# Patient Record
Sex: Male | Born: 1980 | Race: Black or African American | Hispanic: No | Marital: Married | Smoking: Never smoker
Health system: Southern US, Community
[De-identification: ages and names within clinical notes are randomized; demographics above are authoritative.]

## PROBLEM LIST (undated history)

## (undated) DIAGNOSIS — J45909 Unspecified asthma, uncomplicated: Secondary | ICD-10-CM

---

## 2003-11-16 ENCOUNTER — Emergency Department (HOSPITAL_COMMUNITY): Admission: EM | Admit: 2003-11-16 | Discharge: 2003-11-16 | Payer: Self-pay | Admitting: Emergency Medicine

## 2003-11-27 ENCOUNTER — Ambulatory Visit (HOSPITAL_BASED_OUTPATIENT_CLINIC_OR_DEPARTMENT_OTHER): Admission: RE | Admit: 2003-11-27 | Discharge: 2003-11-27 | Payer: Self-pay | Admitting: Orthopedic Surgery

## 2004-07-02 ENCOUNTER — Ambulatory Visit (HOSPITAL_BASED_OUTPATIENT_CLINIC_OR_DEPARTMENT_OTHER): Admission: RE | Admit: 2004-07-02 | Discharge: 2004-07-02 | Payer: Self-pay | Admitting: Orthopedic Surgery

## 2008-07-31 ENCOUNTER — Emergency Department (HOSPITAL_COMMUNITY): Admission: EM | Admit: 2008-07-31 | Discharge: 2008-07-31 | Payer: Self-pay | Admitting: Emergency Medicine

## 2008-08-08 ENCOUNTER — Ambulatory Visit: Payer: Self-pay | Admitting: Radiology

## 2008-08-08 ENCOUNTER — Ambulatory Visit (HOSPITAL_BASED_OUTPATIENT_CLINIC_OR_DEPARTMENT_OTHER): Admission: RE | Admit: 2008-08-08 | Discharge: 2008-08-08 | Payer: Self-pay | Admitting: Family Medicine

## 2009-09-22 IMAGING — CR DG WRIST COMPLETE 3+V*R*
4 series · 4 of 4 positions shown · non-contrast
Comparison: None

CLINICAL DATA: Right wrist pain

RIGHT WRIST - COMPLETE 3+ VIEW

[x wrist pa right]
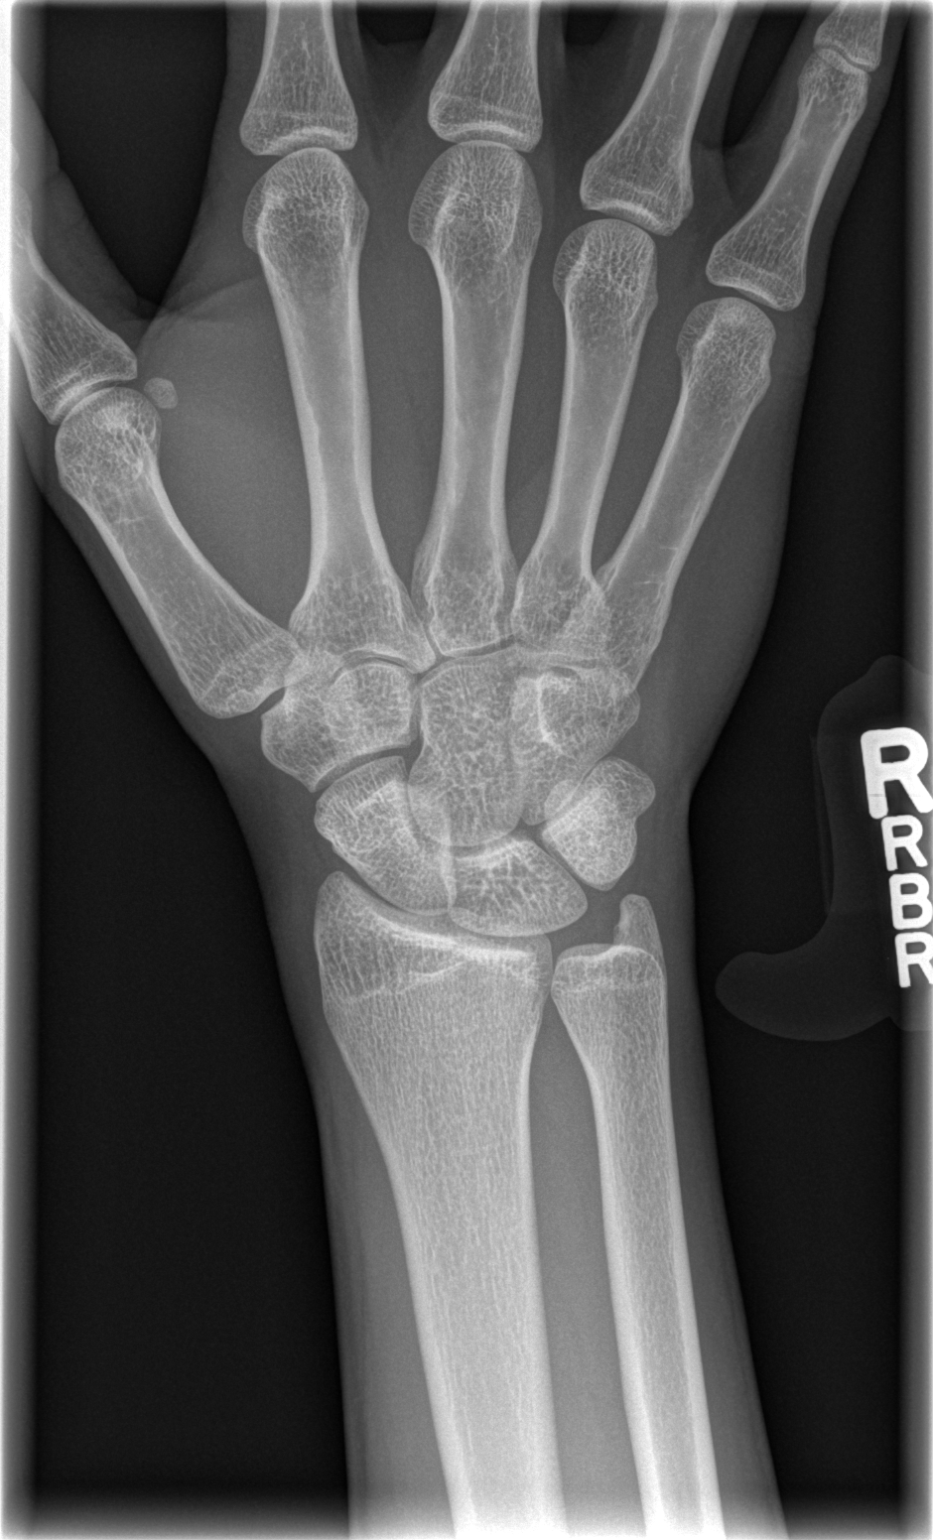

[x wrist obl right]
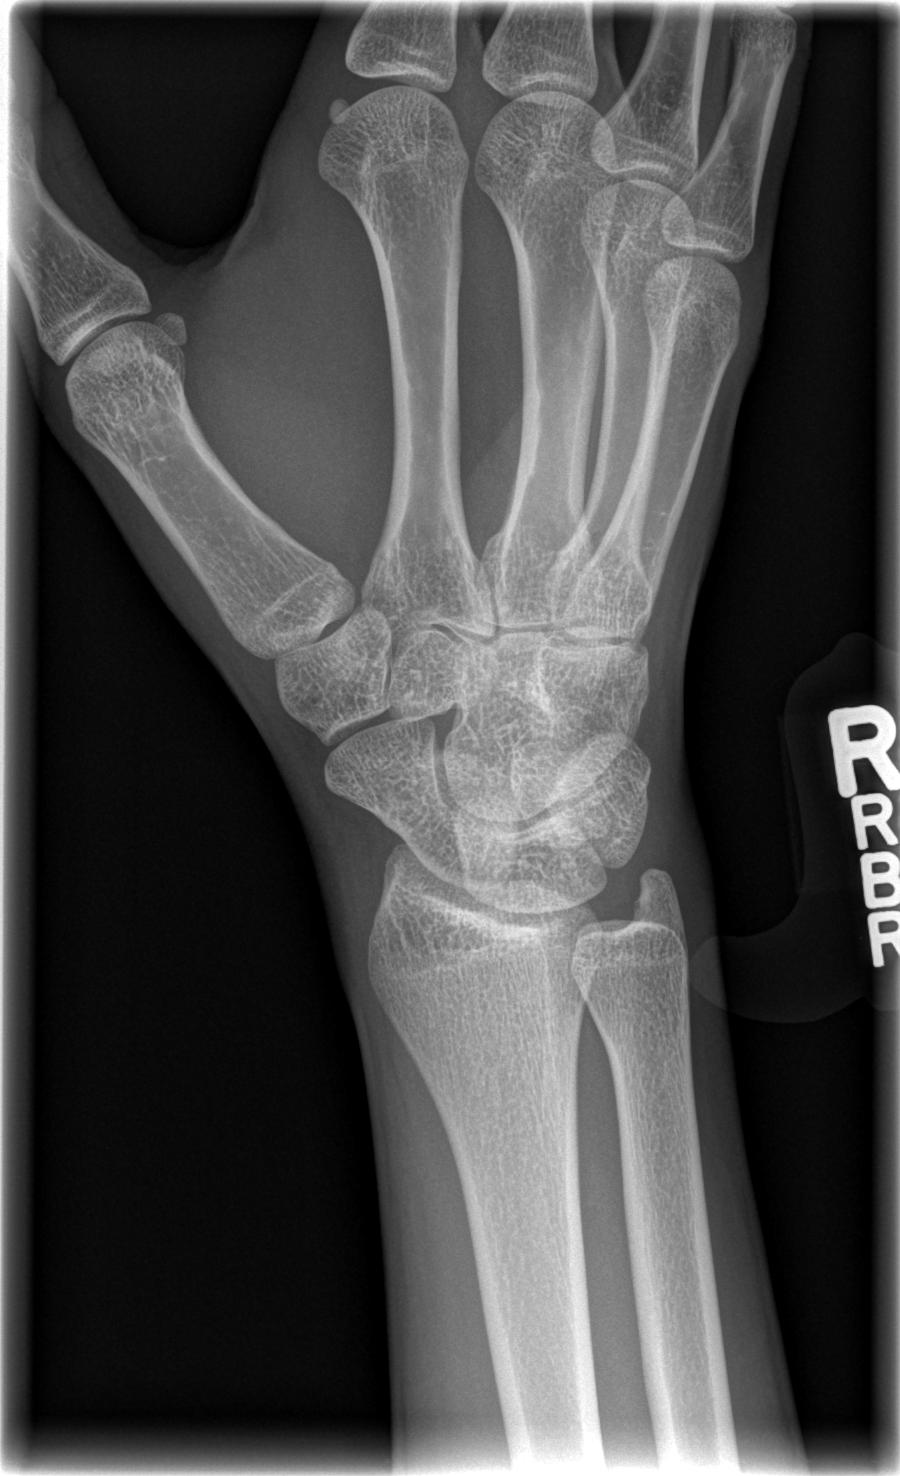

[x wrist lat right]
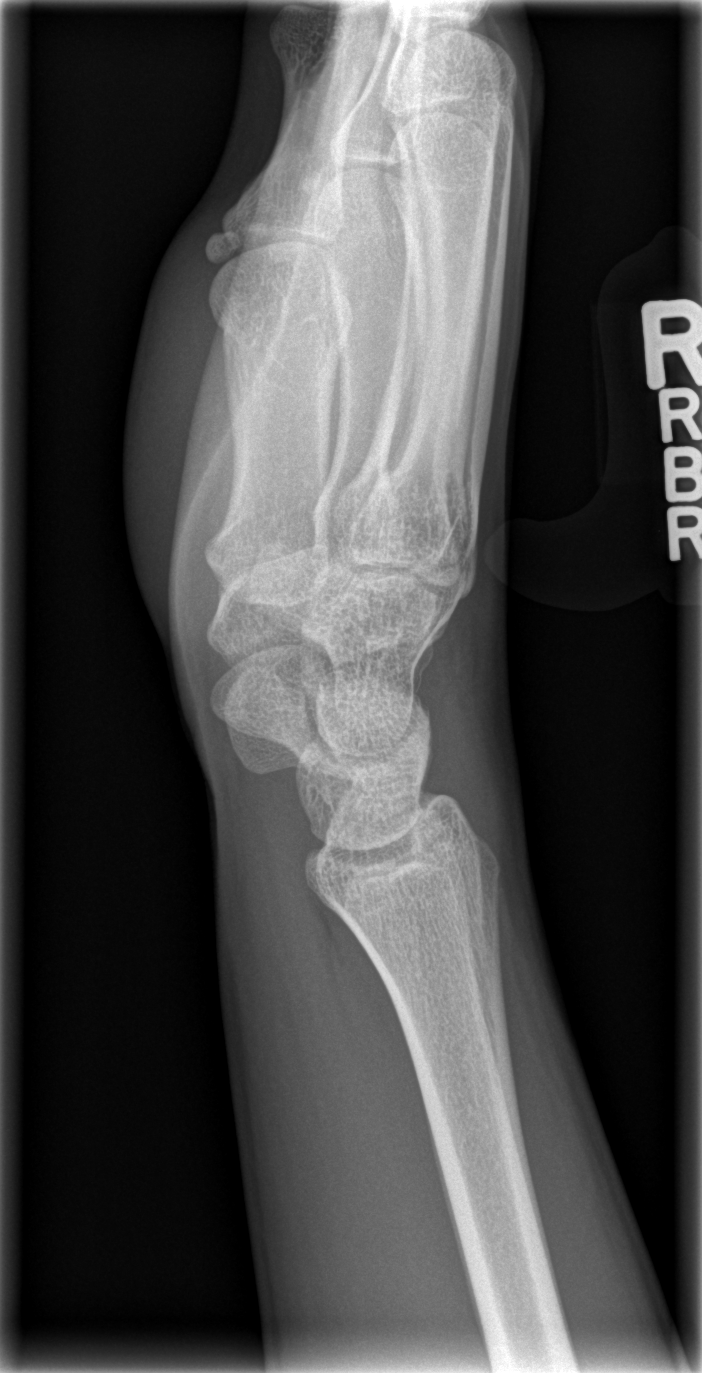

[x navicular]
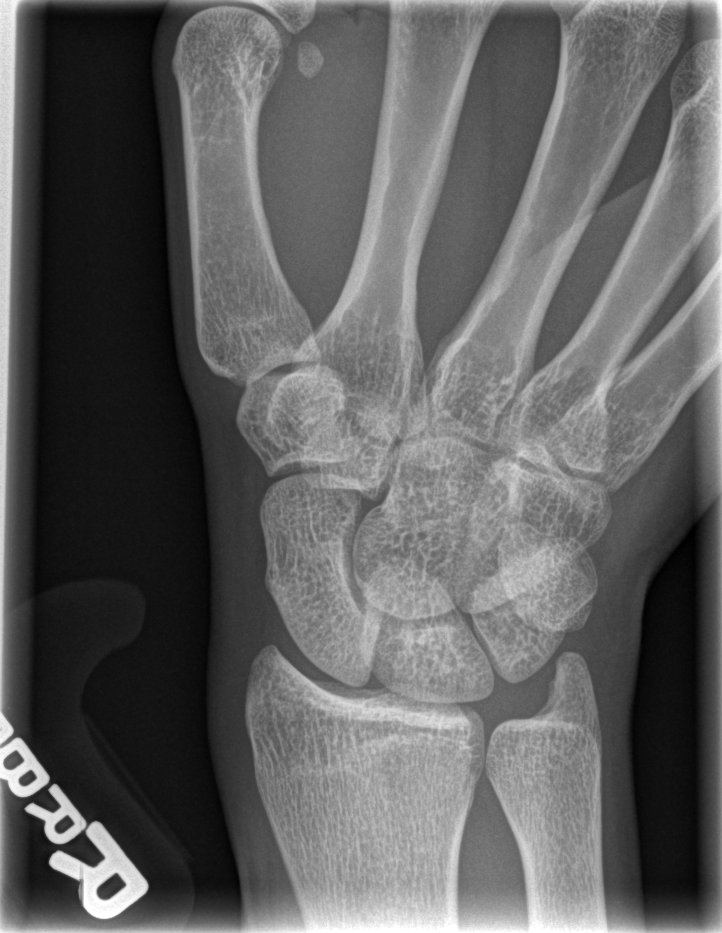

[4 of 4 positions shown; findings below may reference images not displayed]

FINDINGS: There is no evidence of fracture or dislocation.  There
is no evidence of arthropathy or other focal bone abnormality.
Soft tissues are unremarkable.
IMPRESSION: Negative.

## 2010-08-08 NOTE — Op Note (Signed)
NAMEADISA, LITT              ACCOUNT NO.:  [id number]   MEDICAL RECORD NO.:  [id number]          PATIENT TYPE:  AMB   LOCATION:  DSC                          FACILITY:  [facility]   PHYSICIAN:  Leonides Grills, M.D.     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   OPERATION:  1.  Right first and second TMT joint effusion.  2.  Right calcaneal local bone graft.  3.  Stress x-rays, right foot.   ANESTHESIA:  General endotracheal.   SURGEON:  Leonides Grills, M.D.   ASSISTANT:  Lianne Cure, P.A.-C.   ESTIMATED BLOOD LOSS:  Minimal.   TOURNIQUET TIME:  Approximately an hour and a half.   COMPLICATIONS:  None.   DISPOSITION:  Stable to PR.   INDICATIONS:  This is a 30 year old male who sustained a pure ligamentous  Lisfranc injury to the first through fifth tarsometatarsal joints. After  hardware was removed at five months, he had subluxation of first and second  TMT joints with pain. He was consented for the above procedure. All risks  which included infection, neurovascular injury, nonunion, malunion,  __________< hardware failure, persistent pain, worsening pain, prolonged  recovery, stiffness, arthritis __________ were all explained. Questions were  encouraged and answered.   OPERATIVE NOTE:  The patient was brought to the operating room, placed in  supine position. After adequate general endotracheal anesthesia was  administered with popliteal blocks as well as Ancef 1 g IV piggyback, right  lower extremity was then prepped and draped in a sterile manner. With a  proximally placed thigh tourniquet, limb was gradually exsanguinated.  Tourniquet was elevated to 290 mmHg. Longitudinal  incision was made through  the previous incision on the dorsal aspect of the right medial mid foot.  Dissection was carried down through skin. Hemostasis was obtained. Interval  between the extensor hallicis longus and brevis was then developed. Soft  tissue was elevated off the first and second tarsometatarsal joints  dorsally, and both joints were entered. Remaining cartilage was removed. The  curved 1/4-inch osteotome as well as a curet and all Lisfranc ligament was  removed as well. Multiple 2-mm drill holes were placed on either side of the  joint. Longitudinal incision was then made over the calcaneus. A 6-0 drill  hole was placed over the lateral calcaneal tuber lateral wall, and  cancellous bone graft was then obtained using a curet. This was then placed  in the first and second TMT joints, and we first started with the second TMT  joint with a 2-point reduction clamp reducing it anatomically compressing it  and then placing a 3.5-mm fully threaded cortical facet screw using a 2.5-mm  drill hole __________ over the medial aspect of the medial cuneiform,  spanning from the medial cuneiform into the base of the second metatarsal.  Clamp was removed, and this  had excellent maintenance of the compression  across the second TMT joint previous Lisfranc ligament area. We then  anatomically reduced the first TMT joint and with a 2-point reduction clamp  compressed this. We created a notch at the base of the first metatarsal and  a 3.5-mm fully threaded corticol screw using a 3.5/2.5 mm drill holes  respectively creating a lag effect was then performed. This had excellent  compression and maintenance of the correction. Bur was then used to create a  notch into the base of the middle cuneiform, and 3.5/2.5 mm drill holes were  placed respectively, and a 3.5-mm fully threaded cortical lag screw was  placed as well. Final stress x-rays were obtained in AP, lateral and oblique  planes, and these  showed no gross motion of the arthrodesis site. Fixation  was in the proper position as well. Areas were copiously irrigated with  normal saline. Bur was used and bone graft obtained throughout the procedure  as well as from the calcaneus and was then placed as stress strain leaving  bone graft as described by Dutch Quint. Tourniquet was deflated. Hemostasis was  obtained. There was palpable dorsalis pedis pulse. There was no pulsatile  bleeding. Subcu was closed with 3-0 Vicryl. Skin was closed with 4-0 nylon  over all wounds. Sterile dressing was applied. Modified Jones dressing was  applied. The patient was stable to the PR.      PB/MEDQ  D:  07/02/2004  T:  07/02/2004  Job:  [id number]

## 2010-08-08 NOTE — Op Note (Signed)
Joe Quinn, Joe Quinn                          ACCOUNT NO.:  [id number]   MEDICAL RECORD NO.:  [id number]                   PATIENT TYPE:  AMB   LOCATION:  DSC                                  FACILITY:  [facility]   PHYSICIAN:  Leonides Grills, M.D.                  DATE OF BIRTH:  1980/03/29   DATE OF PROCEDURE:  11/27/2003  DATE OF DISCHARGE:                                 OPERATIVE REPORT   PREOPERATIVE DIAGNOSES:  1.  Right first through fifth Lisfranc fracture dislocation, i.e.,      tarsometatarsal joint fracture dislocation.  2.  Right inner cuneiform fracture dislocation.   POSTOPERATIVE DIAGNOSES:  1.  Right firs through fifth Lisfranc fracture dislocation, i.e.,      tarsometatarsal joint fracture dislocation.  2.  Right inner cuneiform fracture dislocation.   OPERATION:  1.  Open reduction-internal fixation first through third tarsometatarsal      joints.  2.  Closed reduction and percutaneous pin fixation fourth and fifth      tarsometatarsal joint.  3.  Stress x-ray, right foot.  4.  Open reduction-internal fixation inner cuneiform fracture dislocation.   SURGEON:  Leonides Grills, M.D.   ASSISTANT:  Lianne Cure, P.A.   ANESTHESIA:  General endotracheal with popliteal block.   ESTIMATED BLOOD LOSS:  Minimal.   TOURNIQUET TIME:  Approximately an hour.   COMPLICATIONS:  None.   DISPOSITION:  Stable to the PR.   INDICATIONS:  This is a 30 year old male, who sustained the above injury  after elevation of soft tissue evaluation.  We brought him back for surgery  as described above.  He was consented for the above procedure. All risks  which include infection, neurovascular injury, persistent pain, worsening  pain, stiffness, arthritis and possible fusion were all explained. Questions  were encouraged and answered.  He was also encouraged to abstain from  smoking as well.   OPERATION:  The patient was brought to the operating room and placed in the  supine  position. After adequate general endotracheal anesthesia was  administered, as well as the popliteal block and Ancef one gram IV  piggyback, the right lower extremity was then prepped and draped in a  sterile manner with a proximally placed thigh tourniquet.  The limb was  gravity exsanguinated, the tourniquet was elevated at 290 mmHg. A  longitudinal incision just lateral to the HL tendon was then made.  Dissection was carried down through the skin.  Hemostasis was obtained.  Approach was just lateral to the HL tendon between [facility] and [facility].  The first  tarsometatarsal joint and the second tarsometatarsal joint were encountered.  The second tarsometatarsal joint was anatomically reduced with a two-point  reduction clamp and then, a 3.5 mm fully threaded, cortical set screw was  then placed using a 2.5 mm drill hole respectively.  This had excellent  maintenance of the reduction and was  visually under C-arm guidance to be in  excellent positon.  Stress x-rays were obtained and showed that there was  complete instability of the first tarsometatarsal joint as well as the  remaining tarsometatarsal joints as well.  We then anatomically reduced the  first tarsometatarsal joint.  A provisional K-wire was then placed to hold  the reduction and a burr was used to create a notch approximately 2 cm  distal to the first tarsometatarsal joint.  With the joint anatomically held  reduced, a 3.5, followed by a 2.5 mm drill hole was then placed and a lag  screw was then placed.  X-rays again were obtain the AP, lateral and oblique  planes and showed that this was in excellent position.  We then made a  longitudinal incision over the third tarsometatarsal joint.  Dissection was  carried out through the skin and hemostasis was obtained.  The third  tarsometatarsal was then anatomically reduced with a two-point reduction  clamp and then a 3.5 mm fully threaded, cortical set screw was then placed  using a 2.5 mm  drill hole respectively and a burr to notch it again at the  base of the third metatarsal. X-rays were obtain in the AP, lateral and  oblique planes and showed that this was in excellent position.  We then with  a two-point reduction clamp, reduced the inner cuneiform joints and through  a separate incision medially, a 3.5 mm fully threaded, cortical set screw  was then placed.  Again, this was visualized in the AP, lateral and oblique  planes to be in excellent position.  We then anatomically reduced the fourth  and fifth tarsometatarsal joints with 2-0 K-wires.  The two 2-0 K-wires were  placed across the fourth and fifth tarsometatarsal joints respectively and  held the joints in an anatomically reduced position.  The tourniquet was  deflated, hemostasis was obtained.  Wounds were copiously irrigated with  normal saline.  Subcutaneous was closed with 3-0 Vicryl and skin was closed  with 4-0 nylon over all wounds. Sterile dressing was applied.  A modified  Jones dressing was applied.   The patient was stable to the PR.                                               Leonides Grills, M.D.    PB/MEDQ  D:  11/27/2003  T:  11/27/2003  Job:  [id number]

## 2010-08-08 NOTE — Op Note (Signed)
NAMECON, ARGANBRIGHT                          ACCOUNT NO.:  [id number]   MEDICAL RECORD NO.:  [id number]                   PATIENT TYPE:  EMS   LOCATION:  ED                                   FACILITY:  [facility]   PHYSICIAN:  Vania Rea. Supple, M.D.               DATE OF BIRTH:  1980-03-30   DATE OF PROCEDURE:  11/16/2003  DATE OF DISCHARGE:                                 OPERATIVE REPORT   PREOPERATIVE DIAGNOSIS:  Right forefoot tarsal metatarsal joint dislocation  (homolateral Lisfranc dislocation).   POSTOPERATIVE DIAGNOSIS:  Right forefoot tarsal metatarsal joint dislocation  (homolateral Lisfranc dislocation).   PROCEDURE:  Closed reduction under IV sedation of right forefoot  dislocation.   SURGEON OF RECORD:  Francena Hanly, M.D.   ANESTHESIA:  IV sedation.   HISTORY:  Mr. Rudzinski is a 30 year old gentleman, who injured his right foot  early this morning while wrestling at home.  He had immediate complaints  of right foot pain and inability to bear weight.  On evaluation in the  emergency room, he was found to have a swollen mid foot and forefoot with an  obvious lateral translation of the forefoot and metatarsals.  Radiographs  were obtained confirming a complete dislocation of the tarsometatarsal  joints consistent with a homolateral Lisfranc dislocation.  Subsequent  recommendation has been made for closed reduction.  I counseled Mr. Leh  on treatment options as well as risks versus benefits thereof.  Possible  complications of failure of reduction, recurrent instability, and the  probable need for additional internal fixation at a later date will all be  reviewed.  He understands and accepts and agrees with the planned procedure.   PROCEDURE IN DETAIL:  Using routine monitoring, the patient received IV  sedation.  After appropriate relaxation, the first and second toes of the  right foot were placed into finger traps, and 10 pounds o weight was  suspended from the  ankle.  The soft tissue was then allowed to relax and  with appropriate relaxation, manipulation technique was then performed,  reducing the metatarsal bases into the appropriate aspects of the mid tarsal  joints.  A palpable and audible reduction was achieved.  Postreduction x-  rays were then obtained confirming congruent alignment of the mid tarsal  joints.   A bulky dry dressing was then applied.   Postop plans are for patient to be discharged home with Percocet for pain  medication.  Strict icing and elevation.  Nonweightbearing.  Follow up in  the office Monday with my partner, Dr. Leonides Grills for further discussion,  ongoing treatment options, and management.                                               Vania Rea. Supple, M.D.  KMS/MEDQ  D:  11/16/2003  T:  11/17/2003  Job:  [id number]

## 2014-04-22 NOTE — Progress Notes (Signed)
 Quick Note:  Results reviewed during clinic vist ______

## 2014-04-22 NOTE — Progress Notes (Signed)
 Subjective   Patient ID:  Joe Quinn is a 34 y.o. (DOB October 05, 1980) male.     Patient presents with   Ankle Pain    states fell down stairs last night, left ankle inury, swollen and pain, diff walking    Pt presents with L ankle injury/pain:  DOI: 04/21/14, coming down stairs and missed footing, slipped forward down 5 stairs with his L ankle hyperextended in effort to break his fall, caught himself with banister and managed to stay upright. Pain focused in the medial and posterior aspect of the ankle, erythematous along medial malleolus, min swelling, painful to bear weight on it No numbness, tingling, weakness, loss of function, unilateral leg swelling, change of temp in skin of LE. No back pain or pain in UE. He passively elevated it No meds or ice packs yet Hx of L ankle sprain in HS basketball Hx of R ankle fracture  Past Medical History, Past Surgery History, Allergies, Social History, and Family History were reviewed and updated.    Review of Systems is complete and negative except as noted.  Objective   BP 134/73 mmHg  Pulse 83  Temp(Src) 99.3 F (37.4 C) (Oral)  Resp 20  Ht 5' 11 (1.803 m)  Wt 195 lb (88.451 kg)  BMI 27.21 kg/m2 General:  Well Developed, Well Nourished, No distress, well appearing HEENT: Normocephalic, atraumatic, PERRL, EOMI, anicteric sclera, conjunctiva normal, oropharynx moist and clear Neck:  Supple with normal range of motion, no bruit, thyomegaly,no cervical/auricular/supraclavicular LAD  CV:  S1S2, RRR without MGR  Lungs:  CTA with normal effort Skin:  No Focal Rashes or lesions, see extremities for ankle description. Extremities:  No C/C/E.  DP/PT 2+. L ankle min ttp in medial malleolus and posterior aspect at distal portion of Achilles tendon w/o swelling, ecchymosis. There is very subtle swelling and erythema of medial malleolus. AROM of ankle limited by 50% due to apprehension of pain, PROM slightly better, pain with inversion, limp to  favor L ankle. Neg Thompson test. No metatarsal point tenderness, swelling, or ecchymosis. Toe flexion and extension against resistance 5/5, cap refill w/in 2 sec, sensation to light touch intact all surfaces. No unilateral leg swelling, neg Homan's, and normal L knee exam. No spine tenderness, hand grip 5/5.  Neuro:  No focal deficits, CN II-XII intact   Xr Ankle Left Ap Lateral And Oblique  04/22/2014   TECHNIQUE: 3 views left ankle  HISTORY: Fall  FINDINGS: No subluxation or acute fracture. Joint spaces are maintained. Mild soft tissue swelling lateral ankle.    04/22/2014   IMPRESSION: No acute fracture.     Impression   1. Ankle injury, left, initial encounter   2. Sprain of left ankle, unspecified ligament, initial encounter     Plan   Patient's Medications   No medications on file    Orders Placed This Encounter  Procedures   Ankle brace   Crutches   XR Ankle Left Ap Lateral And Oblique   RICE measures discussed Start Ibuprofen  (4) 200 mg tablets every 8 hours, always take with food and water to prevent GI upset. Pt requests opioid pain medication; advised anti-inflammatory medication best for this condition Wear air cast in daytime activities until seen in follow up by your primary care provider within next 7-10 days Weight bearing as tolerated Follow up with primary care within 10 days for recheck, sooner if current condition worsens Risks, benefits, and alternatives of the medications and treatment plan prescribed today were discussed, and  patient expressed understanding. Plan follow-up as discussed or as needed if any worsening symptoms or change in condition.    A yearly preventative health exam was recommended and current age based recommendations were discussed.

## 2015-04-11 NOTE — Progress Notes (Signed)
 Subjective:    Patient ID: Joe Quinn is a 35 y.o. male.  Chief Complaint  Patient presents with   mesenteric adenitis    2 day follow up    HPI Mesenteric Adenitis: Jahir presented 2 days ago with acute onset fever, anorexia, chills, and lower abdominal pain. CT abdomen and pelvis showed question a degree of mesenteric adenitis. He was started on Augmentin X 10 days and Ibuprofen . He reports today he still feels a little bloated. Abdominal pain now 5/10 lower abdomen compared to 8/10. No fevers. Is taking Antibiotics, has developed some diarrhea after contrast from CT scan. Appetite improving, ate rice and chicken today, tolerated well. Drinking well. Able to rest, stayed out of work for 2 days. He reports overall he feels he is 80 percent improved from 2 days ago.  Review of Systems  Constitutional: Positive for fatigue. Negative for appetite change, chills and fever.  Respiratory: Negative.   Cardiovascular: Negative.   Gastrointestinal: Positive for abdominal distention, abdominal pain and diarrhea. Negative for blood in stool, constipation, nausea and vomiting.   No Known Allergies   Current Outpatient Prescriptions:    amoxicillin-clavulanate (AUGMENTIN) 875-125 mg per tablet, Take 1 tablet by mouth 2 times daily., Disp: 20 tablet, Rfl: 0   There are no active problems to display for this patient.  Social History   Social History   Marital status: Married    Spouse name: N/A   Number of children: N/A   Years of education: N/A   Social History Main Topics   Smoking status: Current Some Day Smoker   Smokeless tobacco: None     Comment: occational   Alcohol use None   Drug use: None   Sexual activity: Not Asked   Other Topics Concern   None   Social History Narrative   Objective:  Physical Exam  Constitutional: He is oriented to person, place, and time. He appears well-developed and well-nourished. No distress.  HENT:  Head: Normocephalic and  atraumatic.  Mouth/Throat: Oropharynx is clear and moist. No oropharyngeal exudate.  Eyes: Conjunctivae are normal. Right eye exhibits no discharge. Left eye exhibits no discharge. No scleral icterus.  Cardiovascular: Normal rate, regular rhythm, normal heart sounds and intact distal pulses.  Exam reveals no gallop and no friction rub.   No murmur heard. Pulmonary/Chest: Effort normal and breath sounds normal. No respiratory distress. He has no wheezes. He has no rales.  Abdominal: Soft. Bowel sounds are normal. He exhibits no distension and no mass. There is tenderness. There is no rebound and no guarding.  Mild tenderness to left of umbilicus. No RLQ tenderness. No rebound or guarding.  Neurological: He is alert and oriented to person, place, and time.  Skin: Skin is warm and dry. He is not diaphoretic.  Nursing note and vitals reviewed.   Visit Vitals   BP 122/86 (Site: Right arm, Position: Sitting, BP Cuff Size: Small)   Pulse 75   Resp 12   Ht 1.803 m (5' 11)   Wt 91.2 kg (201 lb)   SpO2 98%   BMI 28.03 kg/m2     Assessment:    1. Mesenteric adenitis       Plan:    Patient Instructions  Mesenteric Adenitis: Overall improving. Continue full course of antibiotics. Continue progressing diet as tolerated. Push fluids. Return for worsening abdominal pain, fevers, vomiting, blood in stool, or any other worsening symptoms, otherwise return in 10-14 days for recheck.  Return in about 2 weeks (around  04/25/2015) for mesenteric adenitis.    Electronically signed by: Duwaine Lenon Lukes, FNP 04/11/2015 5:40 PM   Electronically signed by: Duwaine Lenon Lukes, FNP 04/14/15 1419

## 2020-12-26 NOTE — Telephone Encounter (Signed)
 I spoke to Joe Quinn and he said he wanted Joe Quinn to document medical need to work from home due to asthma triggers at work.  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    Electronically signed by: Almarie Lis Street, CMA 12/26/20 1201
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
## 2021-12-10 NOTE — Progress Notes (Signed)
 The consultant's note has been read, reviewed and appreciated; any recommendations will be addressed or forwarded on to the patient's PCP for further workup, evaluation and treatment as clinically indicated and appropriate.   Electronically signed by: D'Allura, Sal Anthony, DO 12/10/21 [id number]

## 2022-06-02 ENCOUNTER — Encounter: Payer: Self-pay | Admitting: Podiatry

## 2022-06-02 ENCOUNTER — Ambulatory Visit: Payer: BC Managed Care – PPO | Admitting: Podiatry

## 2022-06-02 ENCOUNTER — Ambulatory Visit (INDEPENDENT_AMBULATORY_CARE_PROVIDER_SITE_OTHER): Payer: BC Managed Care – PPO

## 2022-06-02 DIAGNOSIS — M7752 Other enthesopathy of left foot: Secondary | ICD-10-CM

## 2022-06-02 DIAGNOSIS — M216X1 Other acquired deformities of right foot: Secondary | ICD-10-CM | POA: Diagnosis not present

## 2022-06-02 DIAGNOSIS — M2142 Flat foot [pes planus] (acquired), left foot: Secondary | ICD-10-CM | POA: Diagnosis not present

## 2022-06-02 DIAGNOSIS — Q828 Other specified congenital malformations of skin: Secondary | ICD-10-CM

## 2022-06-02 DIAGNOSIS — M2141 Flat foot [pes planus] (acquired), right foot: Secondary | ICD-10-CM

## 2022-06-02 DIAGNOSIS — M7751 Other enthesopathy of right foot: Secondary | ICD-10-CM

## 2022-06-02 DIAGNOSIS — M79671 Pain in right foot: Secondary | ICD-10-CM

## 2022-06-02 DIAGNOSIS — T8484XA Pain due to internal orthopedic prosthetic devices, implants and grafts, initial encounter: Secondary | ICD-10-CM

## 2022-06-02 DIAGNOSIS — M7750 Other enthesopathy of unspecified foot: Secondary | ICD-10-CM

## 2022-06-02 NOTE — Progress Notes (Signed)
Subjective:  Patient ID: Joe Quinn, male    DOB: 03-Nov-1980,  MRN: [id number]  Chief Complaint  Patient presents with   Foot Pain    right great toe bruised at the base of toe - very stiff - pain at times - plantars wart / left foot pain in bottom - history of right foot surgery - concerned about the hardware in his foot    42 y.o. male presents with the above complaint. History confirmed with patient.  He said that he has a history of right foot injury requiring surgery, which sounds to be a Lisfranc dislocation, this happened several years ago.  Recently the toe has been getting progressively stiffer on the great toe on the right foot.  He is also had a large painful skin lesion on the bottom of the right foot, he says this was worse after surgery that he did not completely follow the instructions afterwards and walk on his cast even though he should not have.  He also notes pain on the arch on the left foot but has been getting worse as well  Objective:  Physical Exam: warm, good capillary refill, no trophic changes or ulcerative lesions, normal DP and PT pulses, and normal sensory exam. Left Foot:  Significant pes planus deformity with collapse of the medial arch and tenderness along the medial arch Right Foot:  Mild pes planus deformity, he has a large callus submetatarsal 5, IPK submetatarsal 1 fixed forefoot varus deformity, limited dorsiflexion of the first MTPJ with pain on range of motion, edema here, no evidence of instability or ligamentous rupture  No images are attached to the encounter.  Radiographs: Multiple views x-ray of both feet: Pes planus deformity with midfoot degenerative changes noted on the left side, on the right side he has screw fixation of the first TMT arthrodesis and Lisfranc ORIF with intercuneiform osseous fusion Assessment:   1. Pes planus of both feet   2. Capsulitis of metatarsophalangeal (MTP) joint of right foot   3. Porokeratosis   4. Forefoot  varus, acquired, right   5. Pain in right foot [M79.671]      Plan:  Patient was evaluated and treated and all questions answered.  We reviewed his radiographs together.  We discussed that he has multiple issues that are all likely interrelated.  Suspect that he has acquired forefoot varus deformity secondary to his Lisfranc ORIF which has led to compensatory pressure under 1 and 5 developing the callus and IPK.  This also led to hallux limitus and jamming of the joint which appears to be largely functional but does have evidence of capsulitis.  We discussed long-term he is likely to develop osteoarthritis of this joint and may need eventual fusion.  Today we discussed symptomatic relief with corticosteroid injection and use of a topical anti-inflammatory such as Voltaren gel as needed.  Following verbal consent and prepped with Betadine, the right first MTPJ was injected with 20 mg Kenalog and 2 mg of dexamethasone and 0.5 cc of Marcaine half percent plain.  He tolerated this well.  Discussed with him these could be done no more than every 3 months  All symptomatic hyperkeratoses were safely debrided with a sterile #15 blade to patient's level of comfort without incident. We discussed preventative and palliative care of these lesions including supportive and accommodative shoegear, padding, prefabricated and custom molded accommodative orthoses, use of a pumice stone and lotions/creams daily.  I recommended using salicylic acid [id number] medicated pads at night now that  the lesion has been debrided  Finally we discussed both his right foot acquired deformity and his pes planus deformity on the left side.  I think both of these issues should do well with support from a custom molded foot orthosis and he was casted for this today.  This will be fashion for support of the medial longitudinal arch on the left side, and support of the fixed forefoot varus with a Morton's extension on the right side.  And unloads  for the porokeratosis.  He will be notified when they are ready.  Return to see me as needed if there are issues further, requires further injections or debridement of the lesions.   Return if symptoms worsen or fail to improve.

## 2022-06-02 NOTE — Patient Instructions (Signed)
Look for salicylic acid [id number] cream or ointment and apply to the thickened dry skin / calluses. This can be bought over the counter, at a pharmacy or online such as [employer].  Look for Voltaren gel at the pharmacy over the counter or online (also known as diclofenac 1% gel). Apply to the painful areas 3-4x daily with the supplied dosing card. Allow to dry for 10 minutes before going into socks/shoes

## 2022-06-09 ENCOUNTER — Telehealth: Payer: Self-pay | Admitting: Podiatry

## 2022-06-09 NOTE — Telephone Encounter (Signed)
Joe Quinn would like accommodation paperwork to be completed to work from home 2 days out of 5 days. 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 If accommodated, I just need the reason patient is unable to work in the office all 5 days. Please advise?

## 2022-06-14 NOTE — Addendum Note (Signed)
Addended bySherryle Lis, Calden Dorsey R on: 06/14/2022 06:58 AM   Modules accepted: Orders

## 2022-07-14 ENCOUNTER — Ambulatory Visit (INDEPENDENT_AMBULATORY_CARE_PROVIDER_SITE_OTHER): Payer: BC Managed Care – PPO

## 2022-07-14 DIAGNOSIS — M2142 Flat foot [pes planus] (acquired), left foot: Secondary | ICD-10-CM

## 2022-07-14 DIAGNOSIS — M2141 Flat foot [pes planus] (acquired), right foot: Secondary | ICD-10-CM

## 2022-07-14 NOTE — Progress Notes (Signed)
Patient presents today to pick up custom molded foot orthotics recommended by Dr. Lilian Kapur.   Orthotics were dispensed and fit was satisfactory. Reviewed instructions for break-in and wear. Written instructions given to patient.  Patient will follow up as needed.

## 2022-08-04 ENCOUNTER — Encounter: Payer: Self-pay | Admitting: Podiatry

## 2022-08-04 ENCOUNTER — Ambulatory Visit: Payer: BC Managed Care – PPO | Admitting: Podiatry

## 2022-08-04 DIAGNOSIS — M722 Plantar fascial fibromatosis: Secondary | ICD-10-CM | POA: Diagnosis not present

## 2022-08-04 DIAGNOSIS — M2141 Flat foot [pes planus] (acquired), right foot: Secondary | ICD-10-CM | POA: Diagnosis not present

## 2022-08-04 DIAGNOSIS — M7751 Other enthesopathy of right foot: Secondary | ICD-10-CM

## 2022-08-04 DIAGNOSIS — M205X1 Other deformities of toe(s) (acquired), right foot: Secondary | ICD-10-CM | POA: Diagnosis not present

## 2022-08-04 DIAGNOSIS — G5791 Unspecified mononeuropathy of right lower limb: Secondary | ICD-10-CM

## 2022-08-04 DIAGNOSIS — M2142 Flat foot [pes planus] (acquired), left foot: Secondary | ICD-10-CM

## 2022-08-04 NOTE — Patient Instructions (Signed)
Call  [phone] to schedule PT at [facility]

## 2022-08-07 ENCOUNTER — Encounter: Payer: Self-pay | Admitting: Podiatry

## 2022-08-07 NOTE — Progress Notes (Signed)
  Subjective:  Patient ID: Joe Quinn, male    DOB: March 08, 1981,  MRN: [id number]  Chief Complaint  Patient presents with   Flat Foot    Follow up foot pain - Left foot is the same if not worse pain, the right is still very numb    42 y.o. male presents with the above complaint. History confirmed with patient.  He said that he has a history of right foot injury requiring surgery, which sounds to be a Lisfranc dislocation, this happened several years ago.  Recently the toe has been getting progressively stiffer on the great toe on the right foot.  He is also had a large painful skin lesion on the bottom of the right foot, he says this was worse after surgery that he did not completely follow the instructions afterwards and walk on his cast even though he should not have.  He also notes pain on the arch on the left foot but has been getting worse as well  Interval history:   Since last visit he received his orthotics.  He is pleased with the fit of them.  They have helped some.  He has having some arch pain.  His right great toe is stiff.  Objective:  Physical Exam: warm, good capillary refill, no trophic changes or ulcerative lesions, normal DP and PT pulses, and normal sensory exam. Left Foot:  Significant pes planus deformity with collapse of the medial arch and tenderness along the medial arch Right Foot:  Mild pes planus deformity, he has a large callus submetatarsal 5, IPK submetatarsal 1 fixed forefoot varus deformity, limited dorsiflexion of the first MTPJ with pain on range of motion, edema here, no evidence of instability or ligamentous rupture  No images are attached to the encounter.  Radiographs: Multiple views x-ray of both feet: Pes planus deformity with midfoot degenerative changes noted on the left side, on the right side he has screw fixation of the first TMT arthrodesis and Lisfranc ORIF with intercuneiform osseous fusion Assessment:   1. Pes planus of both feet   2.  Plantar fasciitis, left   3. Capsulitis of metatarsophalangeal (MTP) joint of right foot   4. Hallux limitus, right   5. Neuritis of right foot      Plan:  Patient was evaluated and treated and all questions answered.  He is still having quite a bit of pain.  His orthotics do fit well.  I do think this will take some further time with support immobilization in the orthoses to improve completely.  I am not sure that hardware removal of the first TMT will improve much with stiffness at his right great toe joint.,  I would like him to start physical therapy for medial band plantar fascia on the left foot.  Referral sent to [facility] on currently [place].  Also can work on range of motion of the right great toe.  I will see him back in 3 months for follow-up.  Advised to return sooner if further issues or worsens before then.   Return in about 3 months (around 11/04/2022) for follow up after PT.

## 2022-09-03 ENCOUNTER — Ambulatory Visit: Payer: BC Managed Care – PPO | Admitting: Podiatry

## 2022-11-05 ENCOUNTER — Ambulatory Visit: Payer: BC Managed Care – PPO | Admitting: Podiatry

## 2022-11-05 ENCOUNTER — Encounter: Payer: Self-pay | Admitting: Podiatry

## 2022-11-05 DIAGNOSIS — Q828 Other specified congenital malformations of skin: Secondary | ICD-10-CM | POA: Diagnosis not present

## 2022-11-05 DIAGNOSIS — M7751 Other enthesopathy of right foot: Secondary | ICD-10-CM

## 2022-11-05 DIAGNOSIS — T8484XA Pain due to internal orthopedic prosthetic devices, implants and grafts, initial encounter: Secondary | ICD-10-CM

## 2022-11-05 DIAGNOSIS — M2141 Flat foot [pes planus] (acquired), right foot: Secondary | ICD-10-CM | POA: Diagnosis not present

## 2022-11-05 DIAGNOSIS — M2142 Flat foot [pes planus] (acquired), left foot: Secondary | ICD-10-CM

## 2022-11-05 NOTE — Progress Notes (Signed)
  Subjective:  Patient ID: Joe Quinn, male    DOB: 1980-11-17,  MRN: [id number]  Chief Complaint  Patient presents with   Foot Pain    "It feels like it's worse.  The toe is still numb.  There's a callus or plantars wart under my big toe."    42 y.o. male presents with the above complaint. History confirmed with patient.  He said that he has a history of right foot injury requiring surgery, which sounds to be a Lisfranc dislocation, this happened several years ago.  Recently the toe has been getting progressively stiffer on the great toe on the right foot.  He is also had a large painful skin lesion on the bottom of the right foot, he says this was worse after surgery that he did not completely follow the instructions afterwards and walk on his cast even though he should not have.  He also notes pain on the arch on the left foot but has been getting worse as well  Interval history:   He returns for follow-up still using the orthotics, still going to physical therapy as well.  Feels the physical therapy has not helped much, but feels still really stiff, pain and numbness in the toe and on the wart on the bottom of the foot  Objective:  Physical Exam: warm, good capillary refill, no trophic changes or ulcerative lesions, normal DP and PT pulses, and normal sensory exam. Left Foot:  Significant pes planus deformity with collapse of the medial arch and tenderness along the medial arch Right Foot:  Mild pes planus deformity, he has a large callus submetatarsal 5, IPK submetatarsal 1 fixed forefoot varus deformity, limited dorsiflexion of the first MTPJ with pain on range of motion, edema here, no evidence of instability or ligamentous rupture  No images are attached to the encounter.  Radiographs: Multiple views x-ray of both feet: Pes planus deformity with midfoot degenerative changes noted on the left side, on the right side he has screw fixation of the first TMT arthrodesis and Lisfranc  ORIF with intercuneiform osseous fusion Assessment:   1. Pes planus of both feet   2. Capsulitis of metatarsophalangeal (MTP) joint of right foot   3. Pain due to internal orthopedic prosthetic devices, implants and grafts, initial encounter (HCC)   4. Porokeratosis       Plan:  Patient was evaluated and treated and all questions answered.  Can discontinue physical therapy at this point.  Continue using orthotics.  Long-term discussed removal of the hardware if not improving.  Also discussed excision of lesion and risks and benefits of this including possibility of painful scar or recurrence.  I recommend we trial Cantharone, he has a difficulty getting 40 % salicylic acid to apply to the lesion.  Debrided lesion with a sharp #312 blade and applied Cantharone.  Dressed with bandage and post care instructions given.  Return in 4 weeks for reevaluation   Return in about 4 weeks (around 12/03/2022) for wart treatment.

## 2022-12-03 ENCOUNTER — Ambulatory Visit: Payer: BC Managed Care – PPO | Admitting: Podiatry

## 2022-12-03 ENCOUNTER — Encounter: Payer: Self-pay | Admitting: Podiatry

## 2022-12-03 DIAGNOSIS — T8484XA Pain due to internal orthopedic prosthetic devices, implants and grafts, initial encounter: Secondary | ICD-10-CM | POA: Diagnosis not present

## 2022-12-03 DIAGNOSIS — D2371 Other benign neoplasm of skin of right lower limb, including hip: Secondary | ICD-10-CM | POA: Diagnosis not present

## 2022-12-03 NOTE — Progress Notes (Signed)
Subjective:  Patient ID: Joe Quinn, male    DOB: 1981/01/27,  MRN: [id number]  Chief Complaint  Patient presents with   Foot Pain    "The bottom is getting better but I've been having some sharp pains where the nails (hardware) are.  My accommodations haven't been taken care of.  I was told that [facility] is no longer here.  I've been in a dilemma about that.  Also, my handicap sticker has run out."    42 y.o. male presents with the above complaint. History confirmed with patient.  He said that he has a history of right foot injury requiring surgery, which sounds to be a Lisfranc dislocation, this happened several years ago.  Recently the toe has been getting progressively stiffer on the great toe on the right foot.  He is also had a large painful skin lesion on the bottom of the right foot, he says this was worse after surgery that he did not completely follow the instructions afterwards and walk on his cast even though he should not have.  He also notes pain on the arch on the left foot but has been getting worse as well  Interval history:   He returns for follow-up, still having quite a bit of pain, the lesion on the bottom is better after using Cantharone, he also used salicylic acid remover, the pain on the top is still there and he would like to have the screws removed  Objective:  Physical Exam: warm, good capillary refill, no trophic changes or ulcerative lesions, normal DP and PT pulses, and normal sensory exam. Left Foot:  Significant pes planus deformity with collapse of the medial arch and tenderness along the medial arch Right Foot:  Mild pes planus deformity, he has a large callus submetatarsal 5, IPK submetatarsal 1 fixed forefoot varus deformity, limited dorsiflexion of the first MTPJ with pain on range of motion, edema here, no evidence of instability or ligamentous rupture, pain over screw sites  No images are attached to the encounter.  Radiographs: Multiple views x-ray  of both feet: Pes planus deformity with midfoot degenerative changes noted on the left side, on the right side he has screw fixation of the first TMT arthrodesis and Lisfranc ORIF with intercuneiform osseous fusion Assessment:   No diagnosis found.     Plan:  Patient was evaluated and treated and all questions answered.  He presents today for follow-up, still having issues over the painful hardware and he would like to have it removed.  So far has not improved with nonoperative treatment.  I discussed with him we could remove the hardware and excised the painful skin lesion if it is still present, we discussed the risk benefits and potential complications of this including but not limited to pain, swelling, infection, scar, numbness which may be temporary or permanent, chronic pain, stiffness, nerve pain or damage, wound healing problems.  He understands and wishes to proceed.  Informed consent signed and reviewed.  All questions addressed.  If the skin lesion resolves before then by using the salicylic acid which he will continue we will not need to remove it.   Surgical plan:  Procedure: -Right foot removal of hardware and excision of benign skin lesion  Location: -[facility]  Anesthesia plan: -IV sedation with local  Postoperative pain plan: - Tylenol 1000 mg every 6 hours, ibuprofen 600 mg every 6 hours, gabapentin 300 mg every 8 hours x5 days, oxycodone 5 mg 1-2 tabs every 6 hours only as needed  DVT prophylaxis: -None required  WB Restrictions / DME needs: -WBAT in CAM boot postop, nonweightbearing if skin lesion needs to be excised     No follow-ups on file.

## 2023-01-05 ENCOUNTER — Telehealth: Payer: Self-pay | Admitting: Podiatry

## 2023-01-05 NOTE — Telephone Encounter (Signed)
DOS- 01/29/2023  REMOVAL FIXATION DEEP RT-20680 EXCISION BENIGN SKIN LESION 1.0 CM RT-11421  [employer] EFFECTIVE DATE- 03/24/2015  DEDUCTIBLE- $500.00 WITH REMAINING $42.48 OOP- $2000.00 WITH REMAINING $1382.48 COINSURANCE- 0%  SPOKE WITH NICOLE B. FROM [employer] AND SHE STATED THAT PRIOR AUTH IS NOT REQUIRED FOR CPT CODES 16109 AND 11421.  CALL REFERENCE NUMBER- [id number]

## 2023-01-13 ENCOUNTER — Encounter: Payer: Self-pay | Admitting: Podiatry

## 2023-01-29 ENCOUNTER — Other Ambulatory Visit: Payer: Self-pay | Admitting: Podiatry

## 2023-01-29 DIAGNOSIS — T8484XA Pain due to internal orthopedic prosthetic devices, implants and grafts, initial encounter: Secondary | ICD-10-CM

## 2023-01-29 DIAGNOSIS — D2371 Other benign neoplasm of skin of right lower limb, including hip: Secondary | ICD-10-CM

## 2023-01-29 MED ORDER — ACETAMINOPHEN 500 MG PO TABS: 1000.0000 mg | ORAL_TABLET | Freq: Four times a day (QID) | ORAL | 0 refills | Status: AC | PRN

## 2023-01-29 MED ORDER — TRAMADOL HCL 50 MG PO TABS: 50.0000 mg | ORAL_TABLET | Freq: Four times a day (QID) | ORAL | 0 refills | Status: AC | PRN

## 2023-01-29 MED ORDER — IBUPROFEN 800 MG PO TABS: 800.0000 mg | ORAL_TABLET | Freq: Three times a day (TID) | ORAL | 0 refills | Status: AC | PRN

## 2023-01-29 NOTE — Progress Notes (Signed)
01/29/23 screw removal, excision benign lesion

## 2023-02-03 ENCOUNTER — Ambulatory Visit (INDEPENDENT_AMBULATORY_CARE_PROVIDER_SITE_OTHER): Payer: BC Managed Care – PPO | Admitting: Podiatry

## 2023-02-03 ENCOUNTER — Encounter: Payer: Self-pay | Admitting: Podiatry

## 2023-02-03 ENCOUNTER — Ambulatory Visit (INDEPENDENT_AMBULATORY_CARE_PROVIDER_SITE_OTHER): Payer: BC Managed Care – PPO

## 2023-02-03 DIAGNOSIS — T8484XA Pain due to internal orthopedic prosthetic devices, implants and grafts, initial encounter: Secondary | ICD-10-CM

## 2023-02-03 DIAGNOSIS — Z9889 Other specified postprocedural states: Secondary | ICD-10-CM

## 2023-02-03 DIAGNOSIS — D2371 Other benign neoplasm of skin of right lower limb, including hip: Secondary | ICD-10-CM

## 2023-02-03 NOTE — Progress Notes (Signed)
  Subjective:  Patient ID: Joe Quinn, male    DOB: 1980/04/29,  MRN: [id number]  Chief Complaint  Patient presents with   Routine Post Op    "I've got my strength back.  My big toe has been numb."    DOS: 01/29/2023 Procedure: Hardware removal and excision of benign skin lesion  42 y.o. male returns for post-op check.   Review of Systems: Negative except as noted in the HPI. Denies N/V/F/Ch.   Objective:  There were no vitals filed for this visit. There is no height or weight on file to calculate BMI. Constitutional Well developed. Well nourished.  Vascular Foot warm and well perfused. Capillary refill normal to all digits.  Calf is soft and supple, no posterior calf or knee pain, negative Homans' sign  Neurologic Normal speech. Oriented to person, place, and time. Epicritic sensation to light touch grossly present bilaterally.  Dermatologic Skin healing well without signs of infection. Skin edges well coapted without signs of infection.  Orthopedic: Tenderness to palpation noted about the surgical site.   Multiple view plain film radiographs: Interval removal of screws without complication Assessment:   1. Status post surgery   2. Pain due to internal orthopedic prosthetic devices, implants and grafts, initial encounter (HCC)   3. Benign neoplasm of skin of right lower extremity    Plan:  Patient was evaluated and treated and all questions answered.  S/p foot surgery right -Progressing as expected post-operatively. -XR: Noted above no complication -WB Status: NWB in surgical shoe with crutches may put heel on ground for transfers and balance -Sutures: Return in 2 weeks to remove. -Medications: No refills required -Foot redressed.  May remove and shower on Monday  Return in about 2 weeks (around 02/17/2023) for suture removal, post op (no x-rays).

## 2023-02-04 ENCOUNTER — Encounter: Payer: BC Managed Care – PPO | Admitting: Podiatry

## 2023-02-15 ENCOUNTER — Encounter: Payer: Self-pay | Admitting: Podiatry

## 2023-02-16 ENCOUNTER — Ambulatory Visit (INDEPENDENT_AMBULATORY_CARE_PROVIDER_SITE_OTHER): Payer: BC Managed Care – PPO | Admitting: Podiatry

## 2023-02-16 ENCOUNTER — Encounter: Payer: Self-pay | Admitting: Podiatry

## 2023-02-16 DIAGNOSIS — T8484XA Pain due to internal orthopedic prosthetic devices, implants and grafts, initial encounter: Secondary | ICD-10-CM

## 2023-02-16 DIAGNOSIS — D2371 Other benign neoplasm of skin of right lower limb, including hip: Secondary | ICD-10-CM

## 2023-02-16 NOTE — Progress Notes (Signed)
  Subjective:  Patient ID: Joe Quinn, male    DOB: November 24, 1980,  MRN: [id number]  Chief Complaint  Patient presents with   Routine Post Op    POV #2, DOS 01/29/2023, RIGHT FOOT SCREW REMOVAL, REMOVAL OF SKIN LESION  "It just gets numb sometimes"    DOS: 01/29/2023 Procedure: Hardware removal and excision of benign skin lesion  42 y.o. male returns for post-op check.   Review of Systems: Negative except as noted in the HPI. Denies N/V/F/Ch.   Objective:  There were no vitals filed for this visit. There is no height or weight on file to calculate BMI. Constitutional Well developed. Well nourished.  Vascular Foot warm and well perfused. Capillary refill normal to all digits.  Calf is soft and supple, no posterior calf or knee pain, negative Homans' sign  Neurologic Normal speech. Oriented to person, place, and time. Epicritic sensation to light touch grossly present bilaterally.  Dermatologic Incisions are well-healed and not hypertrophic  Orthopedic: Tenderness to palpation noted about the surgical site.   Multiple view plain film radiographs: Interval removal of screws without complication Assessment:   1. Pain due to internal orthopedic prosthetic devices, implants and grafts, initial encounter (HCC)   2. Benign neoplasm of skin of right lower extremity    Plan:  Patient was evaluated and treated and all questions answered.  S/p foot surgery right -Doing well all sutures removed he may return to regular shoe gear and activity gradually.  He will continue to work from home through the end of December.  I will see him back in 3 weeks for a final visit also discussed using a silicone scar gel such as Mederma to see if this will alleviate any scarring.  Return in about 3 weeks (around 03/09/2023) for post op (no x-rays).

## 2023-02-16 NOTE — Patient Instructions (Signed)
Starting using Mederma scar gel on your scars to thin them out

## 2023-03-01 ENCOUNTER — Telehealth: Payer: Self-pay | Admitting: Podiatry

## 2023-03-01 NOTE — Telephone Encounter (Signed)
Joe Quinn called requesting an extension on his accommodations -- working from home five (5) days a week which ended 02/22/2023.  I reached out to Dr. Lilian Kapur who approved working from home three (3) days and going into the office two (2) days each week for three (3) months.  However, there will not be another extension after that.  He also does not think you will need another follow up with him after the 03/11/2023 appointment.  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  Sent e-mail to patient to advise same -- he will send new form to be filled out for his employer .Marland Kitchen...      J. Abbott -- 03/01/2023

## 2023-03-04 ENCOUNTER — Telehealth: Payer: Self-pay | Admitting: Podiatry

## 2023-03-04 NOTE — Telephone Encounter (Signed)
Received [facility] Accommodations Request form for the patient ....   Completed and e-mailed back to [facility] [email or web address]>     <><><>     Dr. Lilian Kapur approved patient's work environment as three (3) days work from home and two (2) days work from office -- thru 05/31/2023 with no further extensions.   ....  Form reflects same ....     J. Abbott -- 03/04/2023

## 2023-03-11 ENCOUNTER — Encounter: Payer: Self-pay | Admitting: Podiatry

## 2023-03-11 ENCOUNTER — Ambulatory Visit (INDEPENDENT_AMBULATORY_CARE_PROVIDER_SITE_OTHER): Payer: BC Managed Care – PPO | Admitting: Podiatry

## 2023-03-11 DIAGNOSIS — D2371 Other benign neoplasm of skin of right lower limb, including hip: Secondary | ICD-10-CM

## 2023-03-11 DIAGNOSIS — T8484XA Pain due to internal orthopedic prosthetic devices, implants and grafts, initial encounter: Secondary | ICD-10-CM

## 2023-03-14 NOTE — Progress Notes (Signed)
  Subjective:  Patient ID: Joe Quinn, male    DOB: [birth year],  MRN: [id number]  Chief Complaint  Patient presents with   Routine Post Op    -POV #3, DOS 01/29/2023, RIGHT FOOT SCREW REMOVAL, REMOVAL OF SKIN LESION    DOS: 01/29/2023 Procedure: Hardware removal and excision of benign skin lesion  42 y.o. male returns for post-op check.  He is doing well still having some numbness along the incision  Review of Systems: Negative except as noted in the HPI. Denies N/V/F/Ch.   Objective:  There were no vitals filed for this visit. There is no height or weight on file to calculate BMI. Constitutional Well developed. Well nourished.  Vascular Foot warm and well perfused. Capillary refill normal to all digits.  Calf is soft and supple, no posterior calf or knee pain, negative Homans' sign  Neurologic Normal speech. Oriented to person, place, and time. Epicritic sensation to light touch grossly present bilaterally.  Dermatologic Incisions are well-healed and not hypertrophic  Orthopedic: No pain to palpation.  There is scar tissue around the incisions that is numb   Multiple view plain film radiographs: Interval removal of screws without complication Assessment:   1. Pain due to internal orthopedic prosthetic devices, implants and grafts, initial encounter (HCC)   2. Benign neoplasm of skin of right lower extremity     Plan:  Patient was evaluated and treated and all questions answered.  S/p foot surgery right -Doing well discussed with him that we will take some time for the scar tissue and numbness to fully resolved.  He will return to see me as needed he will continue his modified work schedule for the next few months.  He will follow-up with me as needed.  Return if symptoms worsen or fail to improve.

## 2024-04-04 ENCOUNTER — Other Ambulatory Visit: Payer: Self-pay

## 2024-04-04 ENCOUNTER — Emergency Department (HOSPITAL_BASED_OUTPATIENT_CLINIC_OR_DEPARTMENT_OTHER)

## 2024-04-04 ENCOUNTER — Encounter (HOSPITAL_BASED_OUTPATIENT_CLINIC_OR_DEPARTMENT_OTHER): Payer: Self-pay

## 2024-04-04 ENCOUNTER — Emergency Department (HOSPITAL_BASED_OUTPATIENT_CLINIC_OR_DEPARTMENT_OTHER)
Admission: EM | Admit: 2024-04-04 | Discharge: 2024-04-04 | Disposition: A | Discharge status: Home / Self Care | Attending: Emergency Medicine | Admitting: Emergency Medicine

## 2024-04-04 DIAGNOSIS — R0789 Other chest pain: Secondary | ICD-10-CM | POA: Insufficient documentation

## 2024-04-04 DIAGNOSIS — R42 Dizziness and giddiness: Secondary | ICD-10-CM | POA: Insufficient documentation

## 2024-04-04 DIAGNOSIS — R030 Elevated blood-pressure reading, without diagnosis of hypertension: Secondary | ICD-10-CM | POA: Insufficient documentation

## 2024-04-04 HISTORY — DX: Unspecified asthma, uncomplicated: J45.909

## 2024-04-04 LAB — BASIC METABOLIC PANEL WITH GFR
Anion gap: 14 (ref 5–15)
BUN: 14 mg/dL (ref 6–20)
CO2: 23 mmol/L (ref 22–32)
Calcium: 9.6 mg/dL (ref 8.9–10.3)
Chloride: 101 mmol/L (ref 98–111)
Creatinine, Ser: 0.95 mg/dL (ref 0.61–1.24)
GFR, Estimated: >60 mL/min
Glucose, Bld: 99 mg/dL (ref 70–99)
Potassium: 3.9 mmol/L (ref 3.5–5.1)
Sodium: 139 mmol/L (ref 135–145)

## 2024-04-04 LAB — CBC
HCT: 45.4 % (ref 39.0–52.0)
Hemoglobin: 15.1 g/dL (ref 13.0–17.0)
MCH: 30.3 pg (ref 26.0–34.0)
MCHC: 33.3 g/dL (ref 30.0–36.0)
MCV: 91.2 fL (ref 80.0–100.0)
Platelets: 311 K/uL (ref 150–400)
RBC: 4.98 MIL/uL (ref 4.22–5.81)
RDW: 13.2 % (ref 11.5–15.5)
WBC: 10.3 K/uL (ref 4.0–10.5)
nRBC: 0 % (ref 0.0–0.2)

## 2024-04-04 LAB — TROPONIN T, HIGH SENSITIVITY: Troponin T High Sensitivity: <15 ng/L (ref 0–19)

## 2024-04-04 NOTE — ED Notes (Signed)
 Walked into pt. Room Pt fully dressed and ready to go. Unable to obtain vital signs

## 2024-04-04 NOTE — Discharge Instructions (Signed)
 ### Understanding Your Emergency Department Visit for Chest Tightness and Lightheadedness     You came to the emergency department with chest tightness and lightheadedness. We performed a thorough evaluation to check for serious heart and lung problems. **All of your tests came back normal, which is very reassuring.** The only finding was a mildly elevated blood pressure reading.      ## Your Evaluation Results        **Electrocardiogram (EKG):** This test checks your heart's electrical activity. Your EKG was normal, showing no signs of heart attack or dangerous heart rhythms.      **Blood Tests:** We checked for markers in your blood that would be elevated if you were having a heart attack. These were normal.      **Chest X-ray:** This showed your lungs and heart are normal in appearance.      **Orthostatic Vital Signs:** We checked your blood pressure and heart rate while lying down and standing up. These were normal, meaning you are not dehydrated and your blood pressure is responding normally to position changes.      **HEART Score:** This is a tool doctors use to estimate the risk of heart problems. Your score was 0, which means you are at very low risk (less than 2% chance) of having a heart problem in the next 6 weeks.[1]      **PERC Rule:** This helps determine if you might have a blood clot in your lungs. You met all criteria showing this is extremely unlikely (less than 1% chance).[2][3]      **Blood Pressure:** Your blood pressure was mildly elevated during your visit. Blood pressure can be elevated in the emergency department due to stress, anxiety, or pain. However, it is important to have this checked by your primary care doctor to determine if you have high blood pressure that needs treatment.[4]      ## What Could Be Causing Your Symptoms?        Since serious heart and lung problems have been ruled out, your symptoms are likely due to one of several common, non-dangerous  conditions:[5][6]      **Chest Wall Pain:** Muscle strain or inflammation of the chest wall is very common and can feel like chest tightness. This often gets better with rest and over-the-counter pain relievers.      **Anxiety or Stress:** Anxiety can cause chest tightness, lightheadedness, rapid heartbeat, and shortness of breath. These symptoms are real but not dangerous to your heart. Anxiety can also temporarily raise your blood pressure.[7][8]      **Acid Reflux (GERD):** Stomach acid backing up into the esophagus can cause burning chest discomfort that may feel like chest tightness.      **Costochondritis:** Inflammation of the cartilage connecting your ribs to your breastbone can cause chest pain that may worsen with movement or deep breathing.      ## What Should You Do Now?        **Follow up with your primary care doctor within 1-2 weeks.** This is important to:      - Recheck your blood pressure in a calm, non-emergency setting      - Determine if you need treatment for high blood pressure      - Discuss your symptoms and any ongoing concerns[4][9]      **Monitor your symptoms:** Keep track of when your symptoms occur and what makes them better or worse. This information will help your doctor.      **Consider home blood pressure monitoring:**  Your doctor may recommend checking your blood pressure at home to get a better picture of your typical readings.[9]      **Manage stress:** If you think anxiety might be contributing, consider relaxation techniques, regular exercise, and adequate sleep.      **Over-the-counter pain relief:** If your chest discomfort feels muscular, acetaminophen  or ibuprofen  may help.      ## About High Blood Pressure        High blood pressure (hypertension) is defined as blood pressure of 130/80 mm Hg or higher.[10][11][12] Many people have elevated blood pressure without knowing it. If left untreated over time, high blood pressure can increase  your risk of heart attack, stroke, and kidney disease. The good news is that high blood pressure can usually be controlled with lifestyle changes and, if needed, medication.[12]      Your primary care doctor will help determine if you have high blood pressure and what treatment, if any, you need.      ## When to Return to the Emergency Department        You should return immediately or call 911 if you develop:      - Severe chest pain or pressure, especially if it spreads to your arm, jaw, or back      - Severe shortness of breath      - Fainting or loss of consciousness      - Chest pain with sweating, nausea, or vomiting      - Coughing up blood      - One-sided leg swelling with pain      - Severe headache with confusion, vision changes, or difficulty speaking      ## Important Reminders        - Your risk of a serious heart problem is very low based on today's evaluation      - It is important to follow up with your primary care doctor within 1-2 weeks to have your blood pressure rechecked      - Continue taking any medications prescribed by your doctor      - If you smoke, consider quitting--your doctor can help with this      - Maintain healthy lifestyle habits including regular exercise, a balanced diet low in salt, limiting alcohol, and managing stress      ### References  1. 2022 ACC Expert Consensus Decision Pathway on the Evaluation and Disposition of Acute Chest Pain In the Emergency Department: A Report of the American College of Cardiology Solution Set Oversight Committee. Kontos MC, de Ether SHILLING, Deitelzweig SB, et al. Journal of the [employer] of Cardiology. 2022;80(20):1925-1960. doi:10.1016/j.jacc.2022.08.750. 2. Acute Pulmonary Embolism: A Review. Freund Y, Cohen-Aubart F, Bloom B. JAMA. 2022;328(13):1336-1345. doi:10.1001/jama.7977.83184. 3. Evaluation of Patients With Suspected Acute Pulmonary Embolism: Best Practice Advice From the Clinical  Guidelines Committee of the [employer] of Physicians. Raja AS, Audrey AMBLE, Qaseem A, et al. Annals of Internal Medicine. 2015;163(9):701-11. doi:10.7326/M14-1772. 4. The Management of Elevated Blood Pressure in the Acute Care Setting: A Scientific Statement From the American Heart Association. Bress AP, [employer], Flack JM, et al. Hypertension [place], [place]. : 1979). 2024;81(8):e94-e106. doi:10.1161/HYP.0000000000000238. 5. 2021 AHA/ACC/ASE/CHEST/SAEM/SCCT/SCMR Guideline for the Evaluation and Diagnosis of Chest Pain: A Report of the American College of Cardiology/American Heart Association Joint Committee on Clinical Practice Guidelines. Sherman CHRISTELLA Lipps PD, Marinus BIRCH, et al. Journal of the [facility] of Cardiology. 2021;78(22):e187-e285. doi:10.1016/j.jacc.2021.07.053. 6. Acute Chest Pain in Adults: Outpatient Evaluation. McConaghy MICKEY Frutoso CHRISTELLA Tobie H.  American Family Physician. 2020;102(12):721-727. 7. Acute Severe Hypertension. Augustus BARON. The New England Journal of Medicine. 2019;381(19):1843-1852. doi:10.1056/NEJMcp1901117. 8. Symptoms in Hypertensive Patients Presented to the Emergency Medical Service: A Comprehensive Retrospective Analysis in Clinical Settings. Kowalski S, Goniewicz K, Moskal A, Al-Wathinani AM, Jacobo BATTLE Journal of Clinical Medicine. 2023;12(17):5495. doi:10.3390/jcm12175495. 9. Measurement of Blood Pressure in Humans: A Scientific Statement From the [employer]. Muntner P, Shimbo D, Carey RM, et al. Hypertension [place], [place]. : 1979). 2019;73(5):e35-e66. doi:10.1161/HYP.0000000000000087. 10. Promoting Risk Identification and Reduction of Cardiovascular Disease in Women Through Collaboration With Obstetricians and Gynecologists: A Presidential Advisory From the American Heart Association and the [employer] of Obstetricians and Gynecologists. Delores HL, Warner JJ, Gianos E, et al. Circulation. 2018;137(24):e843-e852.  doi:10.1161/CIR.0000000000000582. 11. Initial Treatment of Hypertension. Taler SJ. The New England Journal of Medicine. 2018;378(7):636-644. doi:10.1056/NEJMcp1613481. 12. Treatment of Hypertension: A Review. Lorene KETTLE, Myrick AE, Whelton PK. JAMA. 2022;328(18):1849-1861. doi:10.1001/jama.7977.80409.

## 2024-04-04 NOTE — ED Notes (Signed)
 ED Provider at bedside.

## 2024-04-04 NOTE — ED Triage Notes (Signed)
 Tightness in chest, SHOB and neck pain since yesterday.  Denies N/V   States had sinus infection last week

## 2024-04-04 NOTE — ED Provider Notes (Signed)
 " [place] [facility] Provider Note   CSN: [id number] Arrival date & time: 04/04/24  1233     Patient presents with: Chest Pain   Joe Quinn is a 44 y.o. male.  He presents with multiple complaints.  First patient reports that yesterday he felt something pulsatile in the back of his neck thought it was his pulse but was not sure if it might have been his muscle.  It was twitching and then went away.  Patient states that he was also helping his kids do some cleaning around the house and then felt lightheaded.  He sat down and this improved.  Today the patient was at work and one of his coworkers made him laugh he felt onset of tightness on the right side of his chest that went across to the left side of his chest.  He then felt a little bit short of breath.  He came in for further evaluation.  Patient did take a ZzzQuil last night so he is wondering if maybe something in the attic that had to do with why he feels so bad today.  He has no recent surgeries confinement history of blood clots unilateral leg swelling leg pain pleuritic chest pain.  Patient denies any history of smoking, hypercholesterolemia, hyperlipidemia, hypertension or family history of early myocardial infarction.    Chest Pain      Prior to Admission medications  Medication Sig Start Date End Date Taking? Authorizing Provider  albuterol (VENTOLIN HFA) 108 (90 Base) MCG/ACT inhaler  01/26/21   [provider]    Allergies: Patient has no known allergies.    Review of Systems  Cardiovascular:  Positive for chest pain.    Updated Vital Signs BP (!) 146/87   Pulse 82   Temp 98.3 F (36.8 C) (Oral)   Resp 17   SpO2 99%   Physical Exam Vitals and nursing note reviewed.  Constitutional:      General: He is not in acute distress.    Appearance: He is well-developed. He is not diaphoretic.  HENT:     Head: Normocephalic and atraumatic.  Eyes:     General: No  scleral icterus.    Conjunctiva/sclera: Conjunctivae normal.  Cardiovascular:     Rate and Rhythm: Normal rate and regular rhythm.     Heart sounds: Normal heart sounds.  Pulmonary:     Effort: Pulmonary effort is normal. No respiratory distress.     Breath sounds: Normal breath sounds. No decreased breath sounds, wheezing, rhonchi or rales.  Abdominal:     Palpations: Abdomen is soft.     Tenderness: There is no abdominal tenderness.  Musculoskeletal:     Cervical back: Normal range of motion and neck supple.  Skin:    General: Skin is warm and dry.  Neurological:     Mental Status: He is alert.  Psychiatric:        Behavior: Behavior normal.     (all labs ordered are listed, but only abnormal results are displayed) Labs Reviewed  BASIC METABOLIC PANEL WITH GFR  CBC  TROPONIN T, HIGH SENSITIVITY  TROPONIN T, HIGH SENSITIVITY    EKG: EKG Interpretation Date/Time:  Tuesday April 04 2024 12:41:09 EST Ventricular Rate:  87 PR Interval:  143 QRS Duration:  99 QT Interval:  281 QTC Calculation: 338 R Axis:   37  Text Interpretation: Sinus rhythm Baseline wander in lead(s) V3 V4 Reconfirmed by Elnor Savant ([id number]) on 04/04/2024 3:32:05 PM  Radiology: DG Chest 2 View Result Date: 04/04/2024 CLINICAL DATA:  Chest pain. EXAM: CHEST - 2 VIEW COMPARISON:  None Available. FINDINGS: The heart size and mediastinal contours are within normal limits. There is no evidence of pulmonary edema, consolidation, pneumothorax, nodule or pleural fluid. The visualized skeletal structures are unremarkable. IMPRESSION: No active cardiopulmonary disease. Electronically Signed   By: Marcey Moan M.D.   On: 04/04/2024 13:15     Procedures   Medications Ordered in the ED - No data to display              HEART Score: 0                    Medical Decision Making Amount and/or Complexity of Data Reviewed Labs: ordered.    Details: Labs wnl Troponin negative x 2 Radiology: ordered and  independent interpretation performed.    Details: I personally visualized and interpreted the images using our PACS system. Acute findings include:  No bnormalities  ECG/medicine tests: independent interpretation performed.    Details: ECG interpretation   Date: 04/04/2024  Rate: 87  Rhythm: normal sinus rhythm  QRS Axis: normal  Intervals: normal  ST/T Wave abnormalities: normal  Conduction Disutrbances: none  Narrative Interpretation: Normal ECG      Given the large differential diagnosis for Jenene Slocumb, the decision making in this case is of high complexity.  After evaluating all of the data points in this case, the presentation of Caswell Alvillar is NOT consistent with Acute Coronary Syndrome (ACS) and/or myocardial ischemia, pulmonary embolism, aortic dissection; Borhaave's, significant arrythmia, pneumothorax, cardiac tamponade, or other emergent cardiopulmonary condition.  Further, the presentation of Kai Calico is NOT consistent with pericarditis, myocarditis, cholecystitis, pancreatitis, mediastinitis, endocarditis, new valvular disease.  Additionally, the presentation of Woodie Sandersis NOT consistent with flail chest, cardiac contusion, ARDS, or significant intra-thoracic or intra-abdominal bleeding.  Moreover, this presentation is NOT consistent with pneumonia, sepsis, or pyelonephritis.  The patient has a HEART Score: 0 PERC negative   Strict return and follow-up precautions have been given by me personally or by detailed written instruction given verbally by nursing staff using the teach back method to the patient/family/caregiver(s).  Data Reviewed/Counseling: I have reviewed the patient's vital signs, nursing notes, and other relevant tests/information. I had a detailed discussion regarding the historical points, exam findings, and any diagnostic results supporting the discharge diagnosis. I also discussed the need for outpatient follow-up and the need to  return to the ED if symptoms worsen or if there are any questions or concerns that arise at home.       Final diagnoses:  Chest discomfort  Light headed  Elevated blood pressure reading    ED Discharge Orders     None          Arloa Chroman, PA-C 04/04/24 1534    Darra Fonda MATSU, MD 04/04/24 1916  "
